# Patient Record
Sex: Male | Born: 1975 | Marital: Married
Health system: Southern US, Community
[De-identification: ages and names within clinical notes are randomized; demographics above are authoritative.]

---

## 2012-05-19 ENCOUNTER — Ambulatory Visit
Admission: RE | Admit: 2012-05-19 | Discharge: 2012-05-19 | Disposition: A | Discharge status: Home / Self Care | Payer: BC Managed Care – PPO | Source: Ambulatory Visit | Attending: Specialist | Admitting: Specialist

## 2012-05-19 ENCOUNTER — Other Ambulatory Visit: Payer: Self-pay | Admitting: Specialist

## 2012-05-19 DIAGNOSIS — R6889 Other general symptoms and signs: Secondary | ICD-10-CM

## 2012-05-19 DIAGNOSIS — R7612 Nonspecific reaction to cell mediated immunity measurement of gamma interferon antigen response without active tuberculosis: Secondary | ICD-10-CM

## 2014-08-15 IMAGING — CR DG CHEST 1V
1 series · 1 of 1 positions shown · non-contrast
Comparison: None.

CLINICAL DATA: Positive TB test

CHEST - 1 VIEW

[w chest pa]
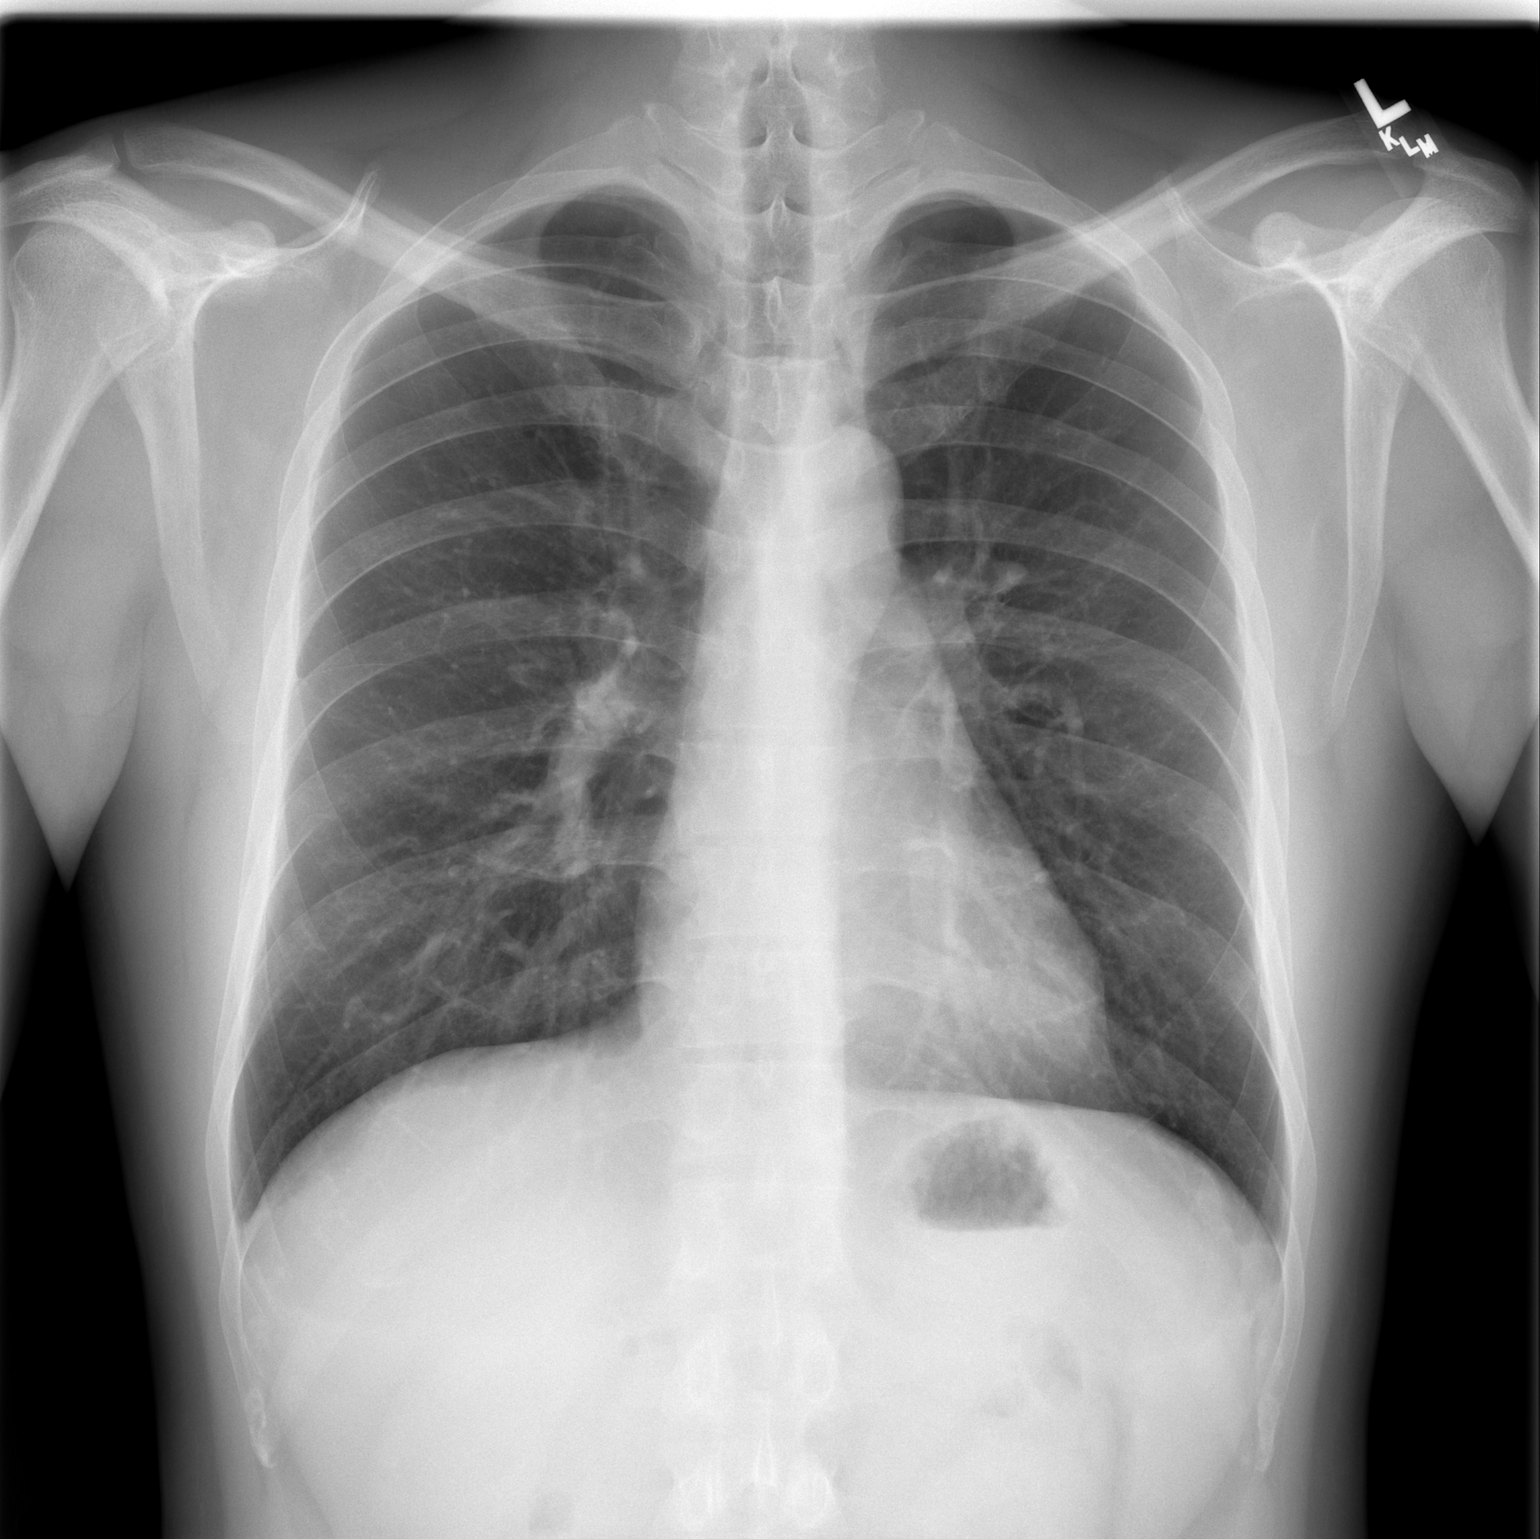

[1 of 1 positions shown; findings below may reference images not displayed]

FINDINGS: No active infiltrate or effusion is seen.  No sequela of
prior tuberculous infection is seen.  Mediastinal contours are
normal.  The heart is within normal limits in size.  No skeletal
abnormality is seen.
IMPRESSION: No active lung disease.

## 2018-04-24 DIAGNOSIS — Z136 Encounter for screening for cardiovascular disorders: Secondary | ICD-10-CM | POA: Diagnosis not present

## 2018-04-24 DIAGNOSIS — Z131 Encounter for screening for diabetes mellitus: Secondary | ICD-10-CM | POA: Diagnosis not present

## 2018-04-24 DIAGNOSIS — Z0000 Encounter for general adult medical examination without abnormal findings: Secondary | ICD-10-CM | POA: Diagnosis not present

## 2019-06-18 ENCOUNTER — Ambulatory Visit: Payer: Self-pay | Attending: Internal Medicine

## 2019-06-18 DIAGNOSIS — Z23 Encounter for immunization: Secondary | ICD-10-CM

## 2019-06-18 NOTE — Progress Notes (Signed)
   Covid-19 Vaccination Clinic  Name:  Keith Harper    MRN: [id number] DOB: [birth year]  06/18/2019  Mr. [name] was observed post Covid-19 immunization for 15 minutes without incident. He was provided with Vaccine Information Sheet and instruction to access the V-Safe system.   Mr. [name] was instructed to call 911 with any severe reactions post vaccine: Marland Kitchen Difficulty breathing  . Swelling of face and throat  . A fast heartbeat  . A bad rash all over body  . Dizziness and weakness   Immunizations Administered    Name Date Dose VIS Date Route   Pfizer COVID-19 Vaccine 06/18/2019  2:36 PM 0.3 mL 03/19/2019 Intramuscular   Manufacturer: [employer], [employer]   Lot: [id number]   NDC: [address]-4

## 2019-07-12 ENCOUNTER — Ambulatory Visit: Payer: Self-pay | Attending: Internal Medicine

## 2019-07-12 DIAGNOSIS — Z23 Encounter for immunization: Secondary | ICD-10-CM

## 2019-07-12 NOTE — Progress Notes (Signed)
   Covid-19 Vaccination Clinic  Name:  Keith Harper    MRN: [id number] DOB: 13-Feb-1976  07/12/2019  Mr. Keith Harper was observed post Covid-19 immunization for 15 minutes without incident. He was provided with Vaccine Information Sheet and instruction to access the V-Safe system.   Mr. Keith Harper was instructed to call 911 with any severe reactions post vaccine: Marland Kitchen Difficulty breathing  . Swelling of face and throat  . A fast heartbeat  . A bad rash all over body  . Dizziness and weakness   Immunizations Administered    Name Date Dose VIS Date Route   Pfizer COVID-19 Vaccine 07/12/2019  3:41 PM 0.3 mL 03/19/2019 Intramuscular   Manufacturer: [employer], [employer]   Lot: [id number]   NDC: [address]-9
